# Patient Record
Sex: Male | Born: 1995 | Race: White | Hispanic: No | Marital: Single | Smoking: Current every day smoker
Health system: Southern US, Community
[De-identification: ages and names within clinical notes are randomized; demographics above are authoritative.]

## PROBLEM LIST (undated history)

## (undated) HISTORY — PX: TIBIA FRACTURE SURGERY: SHX806

---

## 2017-08-14 ENCOUNTER — Encounter: Payer: Self-pay | Admitting: *Deleted

## 2017-08-14 DIAGNOSIS — F1910 Other psychoactive substance abuse, uncomplicated: Secondary | ICD-10-CM | POA: Insufficient documentation

## 2017-08-14 DIAGNOSIS — F17200 Nicotine dependence, unspecified, uncomplicated: Secondary | ICD-10-CM | POA: Insufficient documentation

## 2017-08-14 DIAGNOSIS — Z046 Encounter for general psychiatric examination, requested by authority: Secondary | ICD-10-CM | POA: Insufficient documentation

## 2017-08-14 NOTE — ED Triage Notes (Signed)
Pt brought in by [place] police and was arrested.  Pt needs medical to return to jail   Pt was shooting up heroin at 2100 tonight.  Pt alert.  Pt calm and cooperative. Denies SI.  Denies etoh use.

## 2017-08-15 ENCOUNTER — Emergency Department
Admission: EM | Admit: 2017-08-15 | Discharge: 2017-08-15 | Disposition: A | Discharge status: Home / Self Care | Payer: BLUE CROSS/BLUE SHIELD | Attending: Emergency Medicine | Admitting: Emergency Medicine

## 2017-08-15 DIAGNOSIS — F1910 Other psychoactive substance abuse, uncomplicated: Secondary | ICD-10-CM

## 2017-08-15 NOTE — ED Notes (Signed)
Pt here for medical clearance to go to jail. Per pt he was using heroin before charged by officer. Pt is A&O x4.

## 2017-08-15 NOTE — ED Notes (Signed)
E sign not working, paper signed and placed on chart

## 2017-08-15 NOTE — ED Provider Notes (Signed)
[facility] Emergency Department Provider Note   ____________________________________________    I have reviewed the triage vital signs and the nursing notes.   HISTORY  Chief Complaint Medical Clearance     HPI Darrell Malone is a 21 y.o. male Who presents in police custody for medical clearance. Patient reportedly was using heroin and was arrested today. Initially his heart rate was elevated and so he was sent to the ED for medical clearance. He reports this is likely because he was "stressed out from being arrested. He feels quite well now has no complaints. No chest pain. No abdominal pain. He reports he abuses heroin and Suboxone and weed. Always uses a clean needle. No fevers or chills.   No past medical history on file.  There are no active problems to display for this patient.   No past surgical history on file.  Prior to Admission medications   Not on File     Allergies Patient has no known allergies.  No family history on file.  Social History Social History  Substance Use Topics  . Smoking status: Current Every Day Smoker  . Smokeless tobacco: Never Used  . Alcohol use Yes    Review of Systems  Constitutional: No fever/chills  ENT: No sore throat.   Gastrointestinal: No abdominal pain.  No nausea, no vomiting.    Musculoskeletal: Negative for back pain. Skin: Negative for rash. Neurological: Negative for headaches     ____________________________________________   PHYSICAL EXAM:  VITAL SIGNS: ED Triage Vitals  Enc Vitals Group     BP 08/14/17 2243 114/80     Pulse Rate 08/14/17 2243 90     Resp 08/14/17 2243 18     Temp 08/14/17 2243 98.2 F (36.8 C)     Temp Source 08/14/17 2243 Oral     SpO2 08/14/17 2243 99 %     Weight 08/14/17 2242 68 kg (150 lb)     Height 08/14/17 2242 1.803 m ( )     Head Circumference --      Peak Flow --      Pain Score --      Pain Loc --      Pain Edu? --    Excl. in GC? --     Constitutional: Alert and oriented. No acute distress. Pleasant and interactive   Nose: No congestion/rhinnorhea. Mouth/Throat: Mucous membranes are moist.   Cardiovascular: Normal rate, regular rhythm. normal heart sounds Respiratory: Normal respiratory effort.  No retractions.clear to auscultation bilaterally Genitourinary: deferred Musculoskeletal: No lower extremity tenderness nor edema.   Neurologic:  Normal speech and language. No gross focal neurologic deficits are appreciated.   Skin:  Skin is warm, dry and intact. No rash noted.   ____________________________________________   LABS (all labs ordered are listed, but only abnormal results are displayed)  Labs Reviewed - No data to display ____________________________________________  EKG   ____________________________________________  RADIOLOGY  None ____________________________________________   PROCEDURES  Procedure(s) performed: No    Critical Care performed: No ____________________________________________   INITIAL IMPRESSION / ASSESSMENT AND PLAN / ED COURSE  Pertinent labs & imaging results that were available during my care of the patient were reviewed by me and considered in my medical decision making (see chart for details).  patient well-appearing, exam is unremarkable. Cleared for discharge to jail   ____________________________________________   FINAL CLINICAL IMPRESSION(S) / ED DIAGNOSES  Final diagnoses:  Polysubstance abuse (HCC)      NEW MEDICATIONS STARTED  DURING THIS VISIT:  New Prescriptions   No medications on file     Note:  This document was prepared using Dragon voice recognition software and may include unintentional dictation errors.    Jene Every, MD 08/15/17 [phone]

## 2017-08-23 ENCOUNTER — Emergency Department: Payer: BLUE CROSS/BLUE SHIELD

## 2017-08-23 ENCOUNTER — Emergency Department
Admission: EM | Admit: 2017-08-23 | Discharge: 2017-08-23 | Disposition: A | Discharge status: Home / Self Care | Payer: BLUE CROSS/BLUE SHIELD | Attending: Emergency Medicine | Admitting: Emergency Medicine

## 2017-08-23 ENCOUNTER — Encounter: Payer: Self-pay | Admitting: Emergency Medicine

## 2017-08-23 DIAGNOSIS — X58XXXA Exposure to other specified factors, initial encounter: Secondary | ICD-10-CM | POA: Insufficient documentation

## 2017-08-23 DIAGNOSIS — Y999 Unspecified external cause status: Secondary | ICD-10-CM | POA: Diagnosis not present

## 2017-08-23 DIAGNOSIS — S3021XA Contusion of penis, initial encounter: Secondary | ICD-10-CM | POA: Insufficient documentation

## 2017-08-23 DIAGNOSIS — Y939 Activity, unspecified: Secondary | ICD-10-CM | POA: Insufficient documentation

## 2017-08-23 DIAGNOSIS — S37899A Unspecified injury of other urinary and pelvic organ, initial encounter: Secondary | ICD-10-CM | POA: Diagnosis present

## 2017-08-23 DIAGNOSIS — Y929 Unspecified place or not applicable: Secondary | ICD-10-CM | POA: Insufficient documentation

## 2017-08-23 DIAGNOSIS — F17200 Nicotine dependence, unspecified, uncomplicated: Secondary | ICD-10-CM | POA: Diagnosis not present

## 2017-08-23 MED ORDER — IOPAMIDOL (ISOVUE-300) INJECTION 61%
100.0000 mL | Freq: Once | INTRAVENOUS | Status: AC | PRN
  Administered 2017-08-23: 100 mL via INTRAVENOUS
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
## 2017-08-23 NOTE — ED Notes (Signed)
Patient transported to CT 

## 2017-08-23 NOTE — ED Notes (Signed)

## 2017-08-23 NOTE — ED Notes (Signed)
IV attempt by this RN x2 unsuccessful, charge RN states she will follow up.
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
## 2017-08-23 NOTE — ED Notes (Signed)
Signature pad not responding, pt signed Hard copy of ED discharge and placed on pt's chart.

## 2017-09-03 ENCOUNTER — Emergency Department
Admission: EM | Admit: 2017-09-03 | Discharge: 2017-09-03 | Payer: BLUE CROSS/BLUE SHIELD | Attending: Emergency Medicine | Admitting: Emergency Medicine

## 2017-09-03 DIAGNOSIS — T404X4A Poisoning by other synthetic narcotics, undetermined, initial encounter: Secondary | ICD-10-CM | POA: Insufficient documentation

## 2017-09-03 DIAGNOSIS — Z008 Encounter for other general examination: Secondary | ICD-10-CM | POA: Diagnosis not present

## 2017-09-03 DIAGNOSIS — T50904A Poisoning by unspecified drugs, medicaments and biological substances, undetermined, initial encounter: Secondary | ICD-10-CM

## 2017-09-03 DIAGNOSIS — F17200 Nicotine dependence, unspecified, uncomplicated: Secondary | ICD-10-CM | POA: Diagnosis not present

## 2017-09-03 NOTE — ED Triage Notes (Addendum)
Patient her for medical clearance for jail [place] [facility].  Patient reports he took 14 suboxin approximately 1 hour ago.  Patient denies that he was trying to hurt him self, states he knew he was going to be arrested to he took them to numb the experience.

## 2017-09-03 NOTE — Discharge Instructions (Signed)
Please seek medical attention for any high fevers, chest pain, shortness of breath, change in behavior, persistent vomiting, bloody stool or any other new or concerning symptoms.  

## 2017-09-03 NOTE — ED Provider Notes (Signed)
[facility] Emergency Department Provider Note   ____________________________________________   I have reviewed the triage vital signs and the nursing notes.   HISTORY  Chief Complaint Medical Clearance   History limited by: Not Limited   HPI Darrell Malone is a 21 y.o. male who presents to the emergency department today for medical clearance for jail secondary to suboxone ingestion.  TIMING: happened 2 hours prior to my evaluation QUANTITY: 14 CONTEXT: patient states that he often takes more than his prescribed amount of suboxone. He took many today before going to jail because he thought it would make the process better. He states that he would not have a problem with taking 14 suboxone MODIFYING FACTORS: none ASSOCIATED SYMPTOMS: denies any difficulty with breathing. No chest pain.  Per medical record review patient has a history of polysubstance abuse.  No past medical history on file.  There are no active problems to display for this patient.   No past surgical history on file.  Prior to Admission medications   Not on File    Allergies Patient has no known allergies.  No family history on file.  Social History Social History  Substance Use Topics  . Smoking status: Current Every Day Smoker  . Smokeless tobacco: Never Used  . Alcohol use No    Review of Systems Constitutional: No fever/chills Eyes: No visual changes. ENT: No sore throat. Cardiovascular: Denies chest pain. Respiratory: Denies shortness of breath. Gastrointestinal: No abdominal pain.  No nausea, no vomiting.  No diarrhea.   Genitourinary: Negative for dysuria. Musculoskeletal: Negative for back pain. Skin: Negative for rash. Neurological: Negative for headaches, focal weakness or numbness.  ____________________________________________   PHYSICAL EXAM:  VITAL SIGNS: ED Triage Vitals [09/03/17 2141]  Enc Vitals Group     BP 106/64     Pulse Rate (!) 56   Resp (!) 22     Temp 98.2 F (36.8 C)     Temp Source Oral     SpO2 95 %    Constitutional: Alert and oriented. Well appearing and in no distress. Eyes: Conjunctivae are normal.  ENT   Head: Normocephalic and atraumatic.   Nose: No congestion/rhinnorhea.   Mouth/Throat: Mucous membranes are moist.   Neck: No stridor. Hematological/Lymphatic/Immunilogical: No cervical lymphadenopathy. Cardiovascular: Normal rate, regular rhythm.  No murmurs, rubs, or gallops.  Respiratory: Normal respiratory effort without tachypnea nor retractions. Breath sounds are clear and equal bilaterally. No wheezes/rales/rhonchi. Gastrointestinal: Soft and non tender. No rebound. No guarding.  Genitourinary: Deferred Musculoskeletal: Normal range of motion in all extremities. No lower extremity edema. Neurologic:  Normal speech and language. No gross focal neurologic deficits are appreciated.  Skin:  Skin is warm, dry and intact. No rash noted. Psychiatric: Mood and affect are normal. Speech and behavior are normal. Patient exhibits appropriate insight and judgment.  ____________________________________________    LABS (pertinent positives/negatives)  None  ____________________________________________   EKG  None  ____________________________________________    RADIOLOGY  None  ____________________________________________   PROCEDURES  Procedures  ____________________________________________   INITIAL IMPRESSION / ASSESSMENT AND PLAN / ED COURSE  Pertinent labs & imaging results that were available during my care of the patient were reviewed by me and considered in my medical decision making (see chart for details).  Patient presented to the emergency department today after ingesting more Suboxone than prescribed.  My exam occurred roughly 2 hours after the ingestion.  Patient is awake and alert.  No signs of sedation.  No respiratory distress.  Patient states that he  does often take more than prescribed of his Suboxone.  He states that he has never had a problem taking his Suboxone in the past.  This time given that the patient is completely awake and alert and not showing any signs of concerning depression will discharge.   ____________________________________________   FINAL CLINICAL IMPRESSION(S) / ED DIAGNOSES  Final diagnoses:  Drug overdose, undetermined intent, initial encounter     Note: This dictation was prepared with Dragon dictation. Any transcriptional errors that result from this process are unintentional     Phineas SemenGoodman, Jerett Odonohue, MD 09/04/17 0004

## 2017-11-29 ENCOUNTER — Encounter: Payer: Self-pay | Admitting: Emergency Medicine

## 2017-11-29 ENCOUNTER — Emergency Department

## 2017-11-29 ENCOUNTER — Emergency Department
Admission: EM | Admit: 2017-11-29 | Discharge: 2017-11-29 | Disposition: A | Discharge status: Home / Self Care | Attending: Emergency Medicine | Admitting: Emergency Medicine

## 2017-11-29 ENCOUNTER — Other Ambulatory Visit: Payer: Self-pay

## 2017-11-29 DIAGNOSIS — S60212A Contusion of left wrist, initial encounter: Secondary | ICD-10-CM | POA: Diagnosis not present

## 2017-11-29 DIAGNOSIS — W1830XA Fall on same level, unspecified, initial encounter: Secondary | ICD-10-CM | POA: Insufficient documentation

## 2017-11-29 DIAGNOSIS — Y999 Unspecified external cause status: Secondary | ICD-10-CM | POA: Insufficient documentation

## 2017-11-29 DIAGNOSIS — F17210 Nicotine dependence, cigarettes, uncomplicated: Secondary | ICD-10-CM | POA: Diagnosis not present

## 2017-11-29 DIAGNOSIS — S0101XA Laceration without foreign body of scalp, initial encounter: Secondary | ICD-10-CM

## 2017-11-29 DIAGNOSIS — Y92143 Cell of prison as the place of occurrence of the external cause: Secondary | ICD-10-CM | POA: Diagnosis not present

## 2017-11-29 DIAGNOSIS — Y939 Activity, unspecified: Secondary | ICD-10-CM | POA: Insufficient documentation

## 2017-11-29 MED ORDER — LIDOCAINE HCL (PF) 1 % IJ SOLN
INTRAMUSCULAR | Status: AC
  Filled 2017-11-29: qty 5

## 2017-11-29 MED ORDER — KETOROLAC TROMETHAMINE 60 MG/2ML IM SOLN
60.0000 mg | Freq: Once | INTRAMUSCULAR | Status: AC
  Administered 2017-11-29: 60 mg via INTRAMUSCULAR
  Filled 2017-11-29: qty 2
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
## 2018-03-07 IMAGING — CT CT PELVIS W/ CM
2 of 3 series · 17 of 46 positions shown, 19 images · IV contrast (APPLIED)
Comparison: None.

CLINICAL DATA: Penile injury during sex.  Pain and swelling.

EXAM:
CT PELVIS WITH CONTRAST
TECHNIQUE: Multidetector CT imaging of the pelvis was performed using the
standard protocol following the bolus administration of intravenous
contrast.
CONTRAST:  100mL Y6CRBJ-RHH IOPAMIDOL (Y6CRBJ-RHH) INJECTION 61%

[Series 2: routine abd/pel with · axial · 0.78mm/px · z∈[-1102,-797]mm · 14 of 71 slices shown, 16 images]
[im 5/71  soft-tissue]
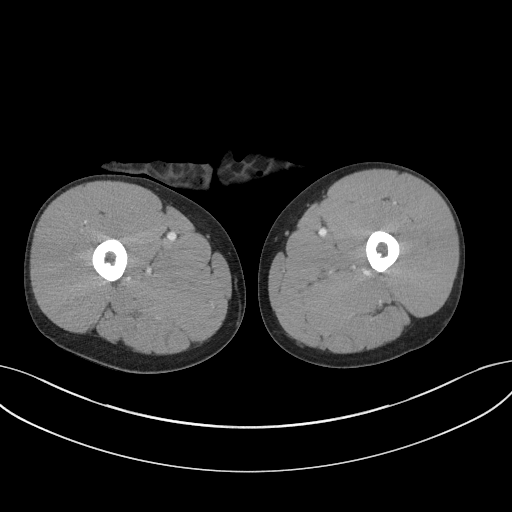
[im 5/71  bone]
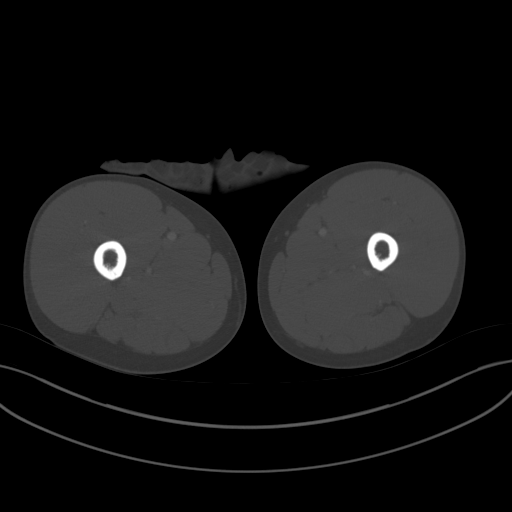
[im 10/71  soft-tissue]
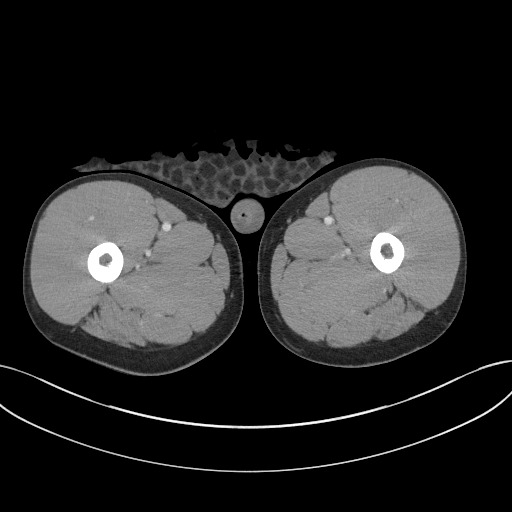
[im 14/71  soft-tissue]
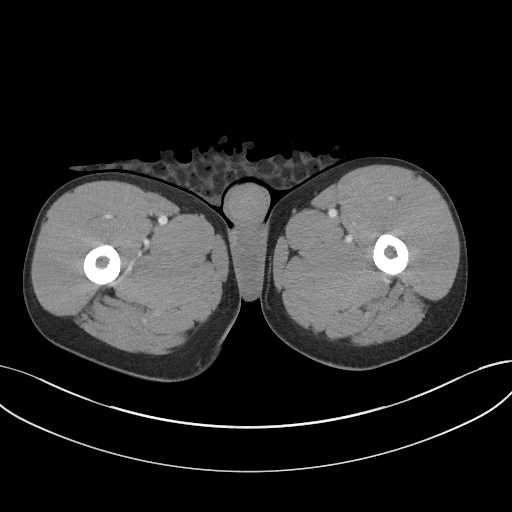
[im 19/71  soft-tissue]
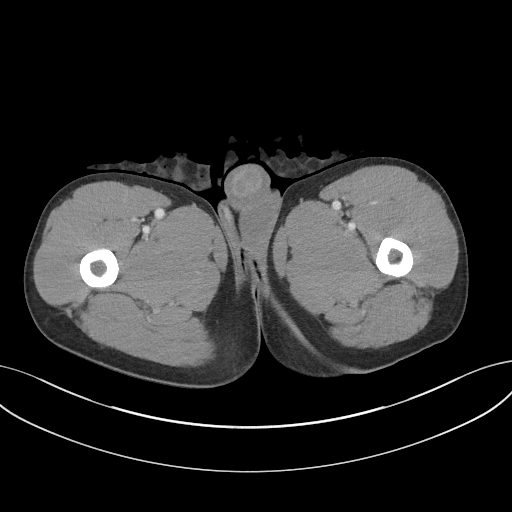
[im 23/71  soft-tissue]
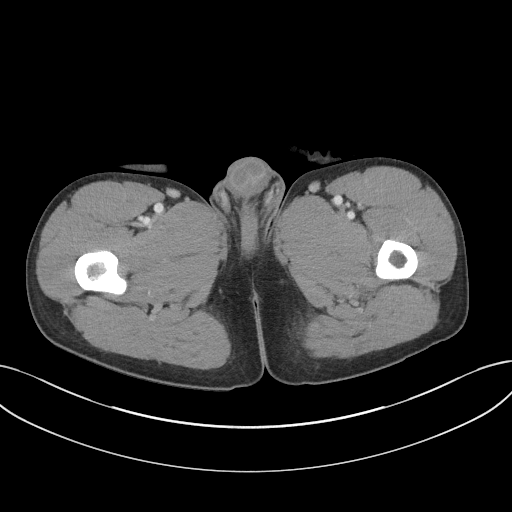
[im 28/71  soft-tissue]
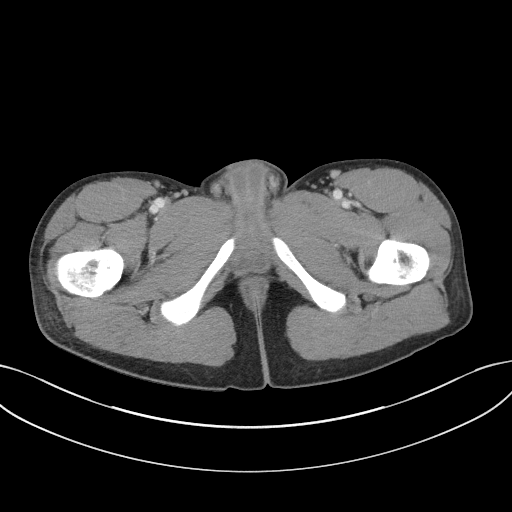
[im 32/71  soft-tissue]
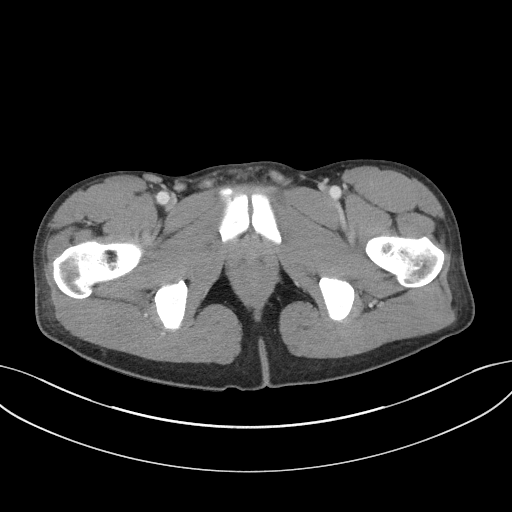
[im 39/71  soft-tissue]
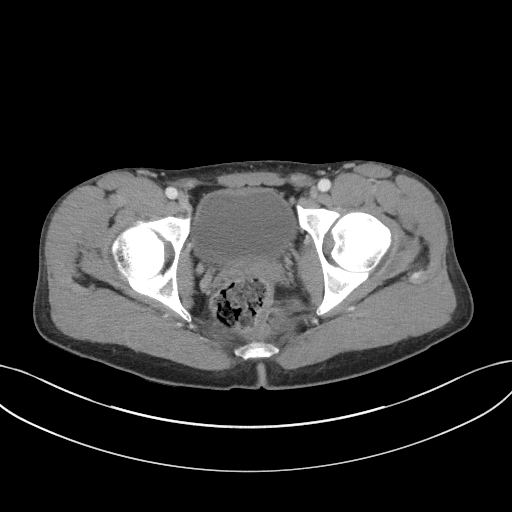
[im 43/71  soft-tissue]
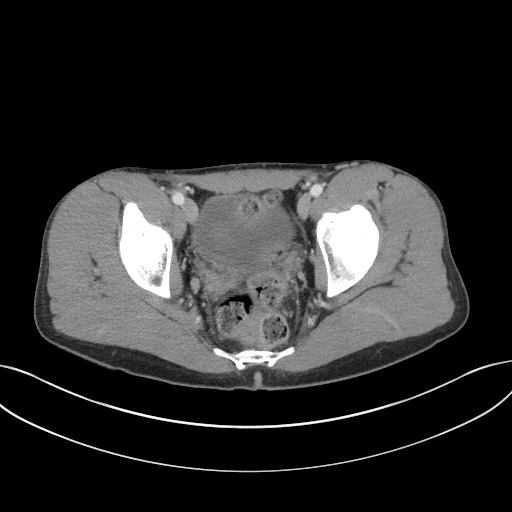
[im 43/71  bone]
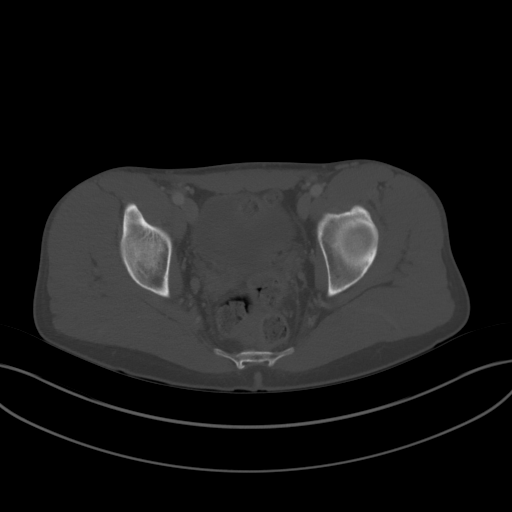
[im 48/71  soft-tissue]
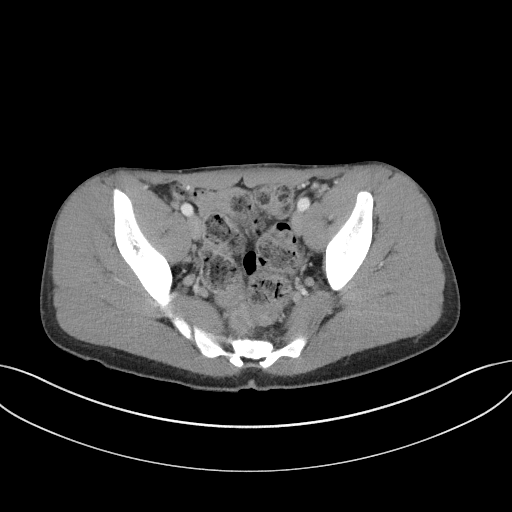
[im 52/71  soft-tissue]
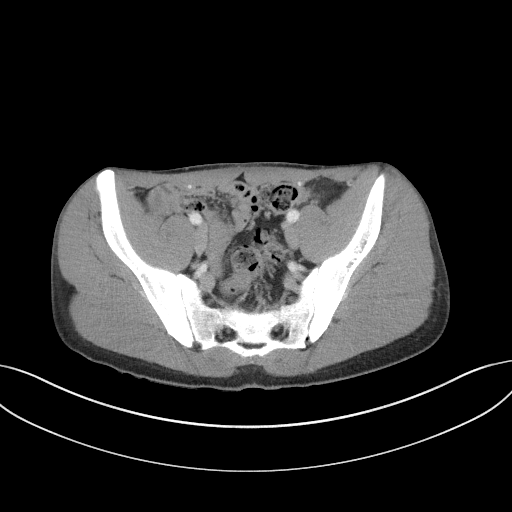
[im 57/71  soft-tissue]
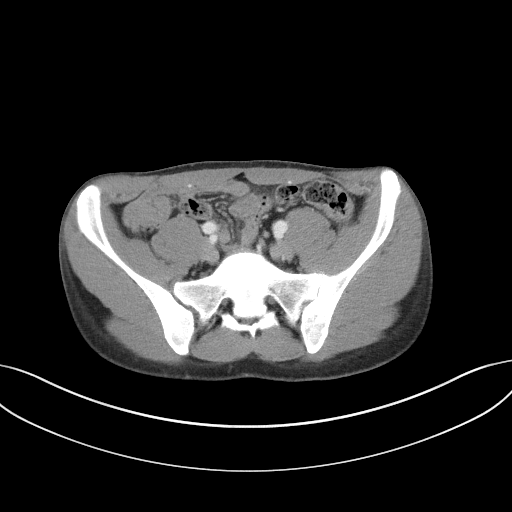
[im 61/71  soft-tissue]
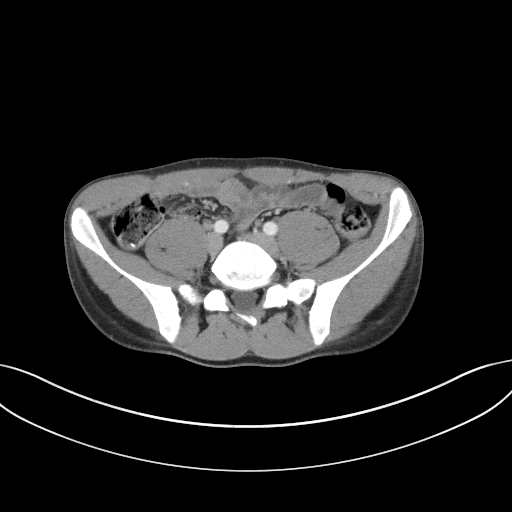
[im 66/71  soft-tissue]
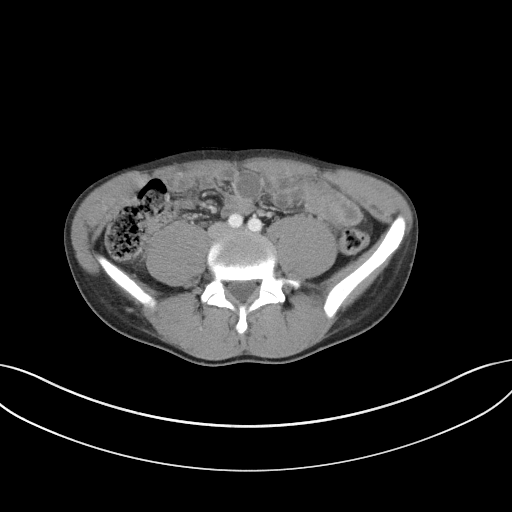

[Series 4: coronal st · coronal · 0.70mm/px · 3 of 60 slices shown]
[im 20/60  soft-tissue]
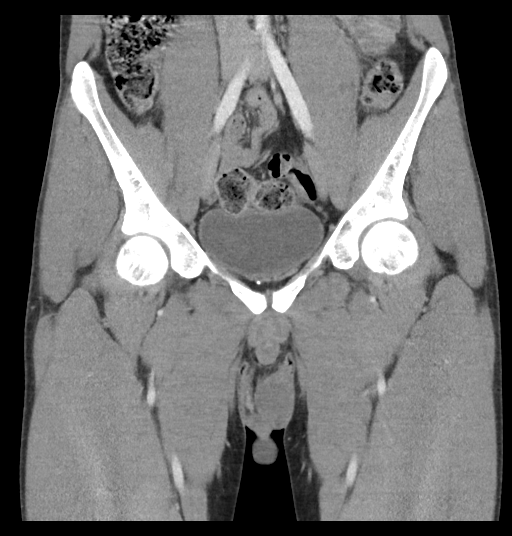
[im 27/60  soft-tissue]
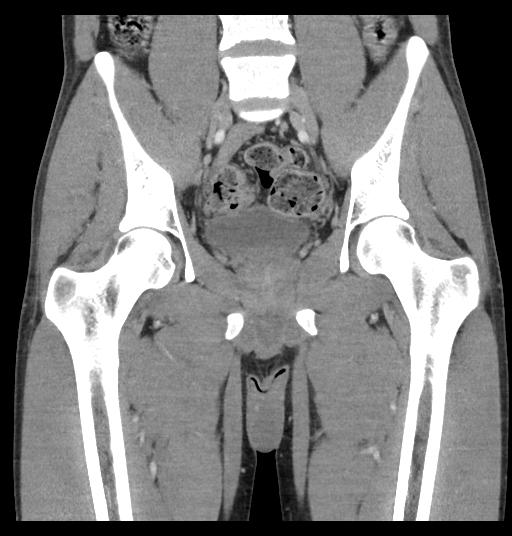
[im 33/60  soft-tissue]
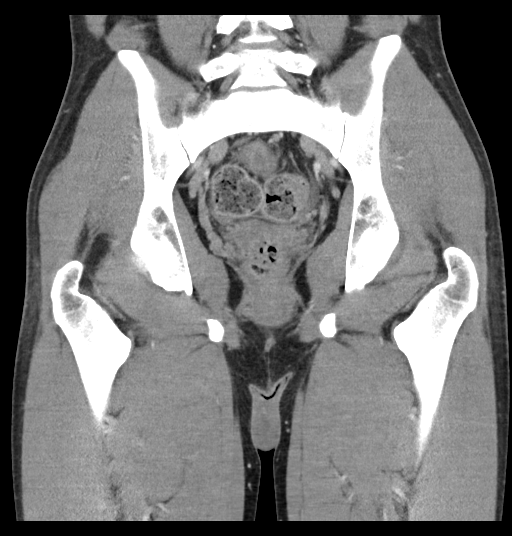

[17 of 46 positions shown; findings below may reference images not displayed]

FINDINGS: Urinary Tract: Bladder wall is not thickened and no bladder filling
defects are demonstrated. Distal ureters are decompressed.

Bowel: Stool-filled rectosigmoid colon. Stool in the remaining
visualized colon. No colonic distention or inflammatory changes
suggested. Visualized small bowel are not abnormally distended.
Appendix is normal.

Vascular/Lymphatic: Aortic bifurcation, iliac, external iliac, and
internal iliac arteries are patent. Venous structures are normal in
caliber.

Reproductive: Prostate gland is not enlarged. Penis appears grossly
intact. No hematoma or loculated fluid collections demonstrated. No
scrotal fluid. Testicles are grossly symmetrical in size.

Other: No free air or free fluid demonstrated in the pelvis. Pelvic
musculature appears intact.

Musculoskeletal: Sacrum, pelvis, and hips appear intact.
IMPRESSION: No acute abnormalities demonstrated.

## 2019-02-02 ENCOUNTER — Other Ambulatory Visit: Payer: Self-pay

## 2019-02-02 ENCOUNTER — Emergency Department
Admission: EM | Admit: 2019-02-02 | Discharge: 2019-02-02 | Disposition: A | Discharge status: Home / Self Care | Payer: BLUE CROSS/BLUE SHIELD | Attending: Emergency Medicine | Admitting: Emergency Medicine

## 2019-02-02 DIAGNOSIS — Z139 Encounter for screening, unspecified: Secondary | ICD-10-CM | POA: Diagnosis present

## 2019-02-02 DIAGNOSIS — F17210 Nicotine dependence, cigarettes, uncomplicated: Secondary | ICD-10-CM | POA: Diagnosis not present

## 2019-02-02 NOTE — ED Triage Notes (Signed)
Pt presents via police voluntarily. Admits ETOH use. Reports not remembering. Psych eval.

## 2019-02-02 NOTE — ED Notes (Signed)
Sandwich tray and drink provided. 

## 2019-02-02 NOTE — ED Provider Notes (Signed)
[facility] Emergency Department Provider Note       Time seen: ----------------------------------------- 3:27 PM on 02/02/2019 -----------------------------------------   I have reviewed the triage vital signs and the nursing notes.  HISTORY   Chief Complaint Psychiatric Evaluation    HPI Darrell Malone is a 23 y.o. male with a history of tibia fracture who presents to the ED for psychiatric evaluation.  Patient presents with police voluntarily.  He admits to alcohol use.  Patient denies suicidal or homicidal ideation.  History reviewed. No pertinent past medical history.  There are no active problems to display for this patient.   Past Surgical History:  Procedure Laterality Date  . TIBIA FRACTURE SURGERY      Allergies Patient has no known allergies.  Social History Social History   Tobacco Use  . Smoking status: Current Every Day Smoker    Packs/day: 0.50    Types: Cigarettes  . Smokeless tobacco: Never Used  Substance Use Topics  . Alcohol use: Yes  . Drug use: Yes    Types: Methamphetamines, Cocaine, Marijuana   Review of Systems Constitutional: Negative for fever. Cardiovascular: Negative for chest pain. Respiratory: Negative for shortness of breath. Gastrointestinal: Negative for abdominal pain, vomiting and diarrhea. Musculoskeletal: Negative for back pain. Skin: Negative for rash. Neurological: Negative for headaches, focal weakness or numbness. Psychiatric: Negative for suicidal or homicidal ideation, positive for alcohol and drug use  All systems negative/normal/unremarkable except as stated in the HPI  ____________________________________________   PHYSICAL EXAM:  VITAL SIGNS: ED Triage Vitals  Enc Vitals Group     BP 02/02/19 1506 (!) 102/50     Pulse Rate 02/02/19 1506 86     Resp 02/02/19 1506 18     Temp 02/02/19 1506 98.1 F (36.7 C)     Temp Source 02/02/19 1506 Oral     SpO2 02/02/19 1506 96 %   Weight 02/02/19 1513 150 lb (68 kg)     Height --      Head Circumference --      Peak Flow --      Pain Score 02/02/19 1513 0     Pain Loc --      Pain Edu? --      Excl. in GC? --    Constitutional: Alert and oriented. Well appearing and in no distress. Eyes: Conjunctivae are normal. Normal extraocular movements. Cardiovascular: Normal rate, regular rhythm. No murmurs, rubs, or gallops. Respiratory: Normal respiratory effort without tachypnea nor retractions. Breath sounds are clear and equal bilaterally. No wheezes/rales/rhonchi. Gastrointestinal: Soft and nontender. Normal bowel sounds Musculoskeletal: Nontender with normal range of motion in extremities. No lower extremity tenderness nor edema. Neurologic:  Normal speech and language. No gross focal neurologic deficits are appreciated.  Skin:  Skin is warm, dry and intact. No rash noted. Psychiatric: Mood and affect are normal. Speech and behavior are normal.  ____________________________________________  ED COURSE:  As part of my medical decision making, I reviewed the following data within the electronic MEDICAL RECORD NUMBER History obtained from family if available, nursing notes, old chart and ekg, as well as notes from prior ED visits. Patient presented for public intoxication, the patient does not require any further evaluation at this point.   Procedures ____________________________________________   DIFFERENTIAL DIAGNOSIS   Medical screening exam, alcohol use disorder, drug abuse  FINAL ASSESSMENT AND PLAN  Medical screening exam   Plan: The patient had presented for public intoxication.  He does not appear to be a threat  to himself or others, he is cleared for outpatient follow-up as needed   Ulice Dash, MD    Note: This note was generated in part or whole with voice recognition software. Voice recognition is usually quite accurate but there are transcription errors that can and very often do occur. I  apologize for any typographical errors that were not detected and corrected.     Emily Filbert, MD 02/02/19 1534

## 2019-02-02 NOTE — ED Triage Notes (Signed)
Pt present via police voluntarily. Admits ETOH use. Pt not remember episode PTA. Psych eval.

## 2019-02-02 NOTE — ED Notes (Signed)
BEHAVIORAL HEALTH ROUNDING Patient sleeping: No. Patient alert and oriented: yes Behavior appropriate: Yes.  ; If no, describe:  Nutrition and fluids offered: yes Toileting and hygiene offered: Yes  Sitter present: q15 minute observations and security  monitoring Law enforcement present: Yes  ODS  

## 2019-02-02 NOTE — ED Notes (Signed)
